# Patient Record
Sex: Male | Born: 1995 | Race: Black or African American | Hispanic: No | Marital: Single | Smoking: Current every day smoker
Health system: Southern US, Community
[De-identification: ages and names within clinical notes are randomized; demographics above are authoritative.]

---
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
## 2016-09-08 ENCOUNTER — Emergency Department (HOSPITAL_COMMUNITY)
Admission: EM | Admit: 2016-09-08 | Discharge: 2016-09-08 | Disposition: A | Discharge status: Home / Self Care | Payer: Medicaid Other | Attending: Physician Assistant | Admitting: Physician Assistant

## 2016-09-08 ENCOUNTER — Encounter (HOSPITAL_COMMUNITY): Payer: Self-pay

## 2016-09-08 ENCOUNTER — Emergency Department (HOSPITAL_COMMUNITY): Payer: Medicaid Other

## 2016-09-08 DIAGNOSIS — M25562 Pain in left knee: Secondary | ICD-10-CM | POA: Insufficient documentation

## 2016-09-08 DIAGNOSIS — X58XXXA Exposure to other specified factors, initial encounter: Secondary | ICD-10-CM | POA: Insufficient documentation

## 2016-09-08 DIAGNOSIS — Y929 Unspecified place or not applicable: Secondary | ICD-10-CM | POA: Insufficient documentation

## 2016-09-08 DIAGNOSIS — Y998 Other external cause status: Secondary | ICD-10-CM | POA: Insufficient documentation

## 2016-09-08 DIAGNOSIS — Y9361 Activity, american tackle football: Secondary | ICD-10-CM | POA: Insufficient documentation

## 2016-09-08 MED ORDER — ACETAMINOPHEN 325 MG PO TABS
650.0000 mg | ORAL_TABLET | Freq: Once | ORAL | Status: AC
  Administered 2016-09-08: 650 mg via ORAL
  Filled 2016-09-08: qty 2

## 2016-09-08 NOTE — Discharge Instructions (Signed)
Read the information below.  Your x-ray does not show any obvious fracture or dislocation. There is an effusion and soft tissue swelling. There may be damage to your ligaments. You are being placed in a knee immobilizer and provided crutches.  Please ice and elevate for 20 minute increments. You can take Tylenol or Motrin for pain relief.  Please call Orthopedics today to schedule a follow up appointment, I have provided the contact information above.  You may return to the Emergency Department at any time for worsening condition or any new symptoms that concern you. Return if you develop fever, warmth, redness, numbness, uncontrolled pain, or any other new/concerning symptoms.

## 2016-09-08 NOTE — ED Triage Notes (Signed)
Pt c/o L knee pain and swelling since last night.  Pain score 7/10.  Pt reports that he was playing football and "got tackled the wrong way."  Pt ambulated to room.
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
## 2018-04-02 IMAGING — CR DG KNEE COMPLETE 4+V*L*
5 series · 5 of 5 positions shown · non-contrast
Comparison: None.

CLINICAL DATA: 20-year-old male with generalized left knee pain and
soft tissue swelling since playing football yesterday. Initial
encounter.

EXAM:
LEFT KNEE - COMPLETE 4+ VIEW

[t knee ap left]
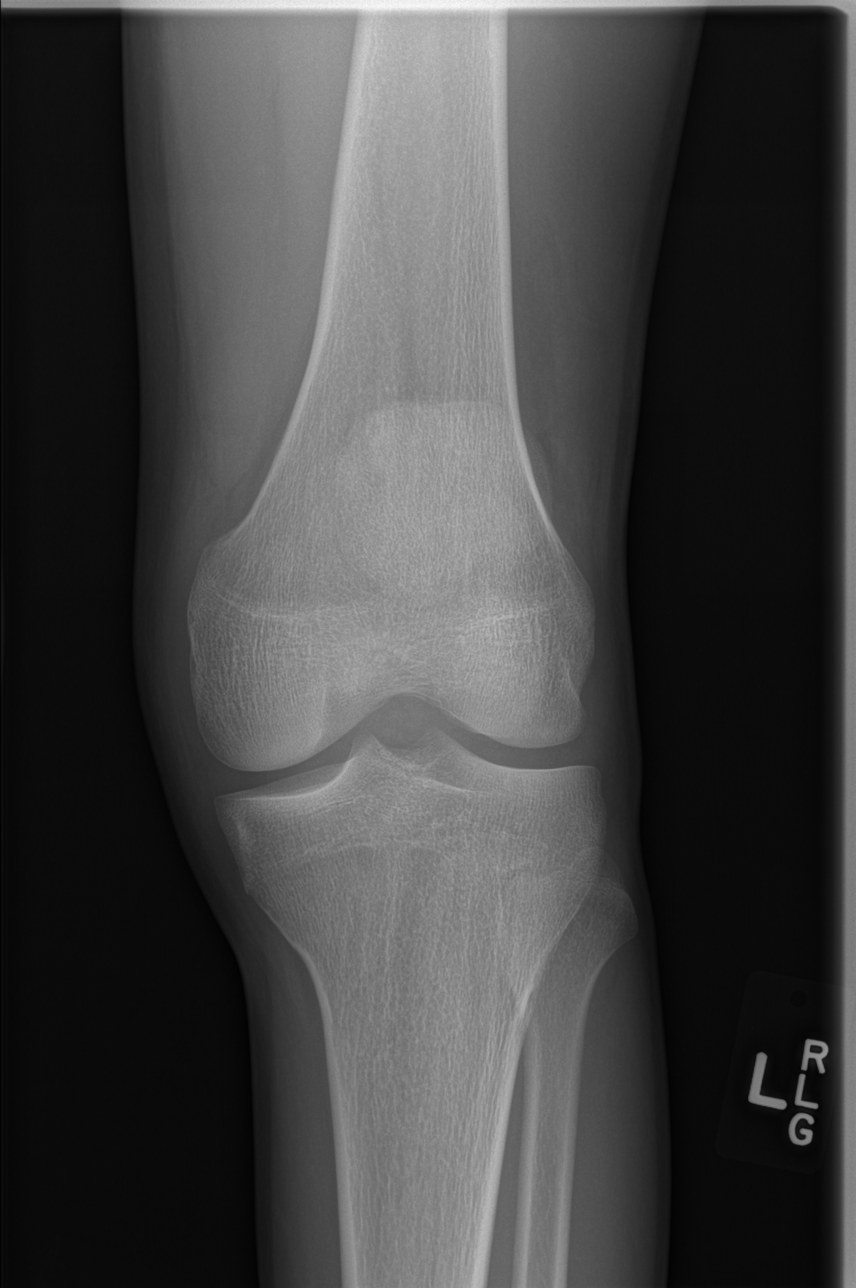

[t knee obl left (1 of 2)]
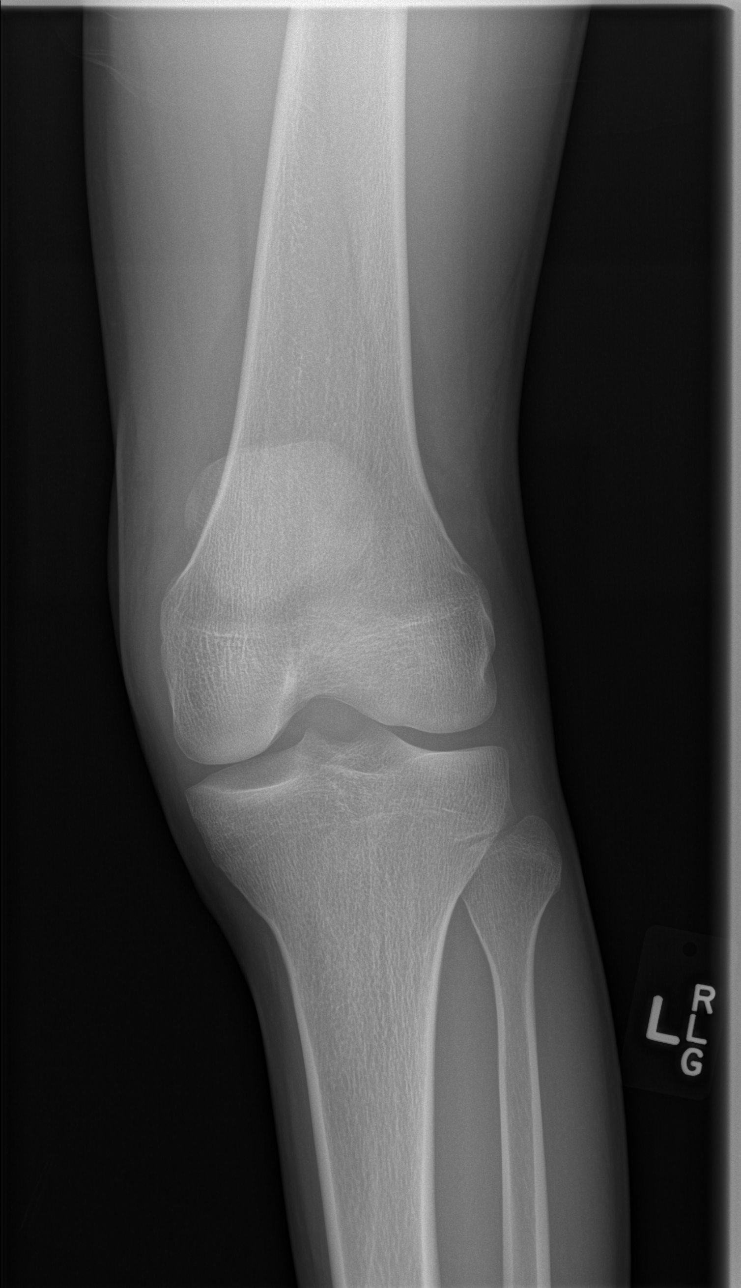

[t knee obl left (2 of 2)]
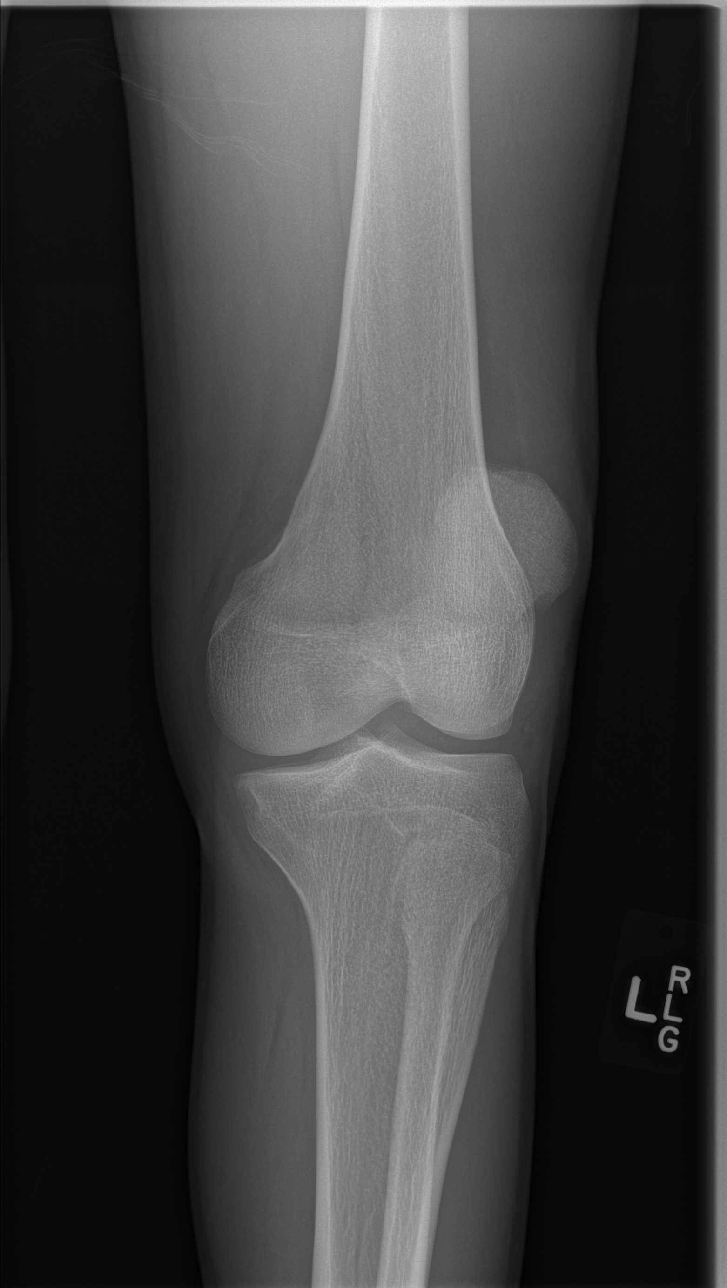

[t knee lat left (1 of 2)]
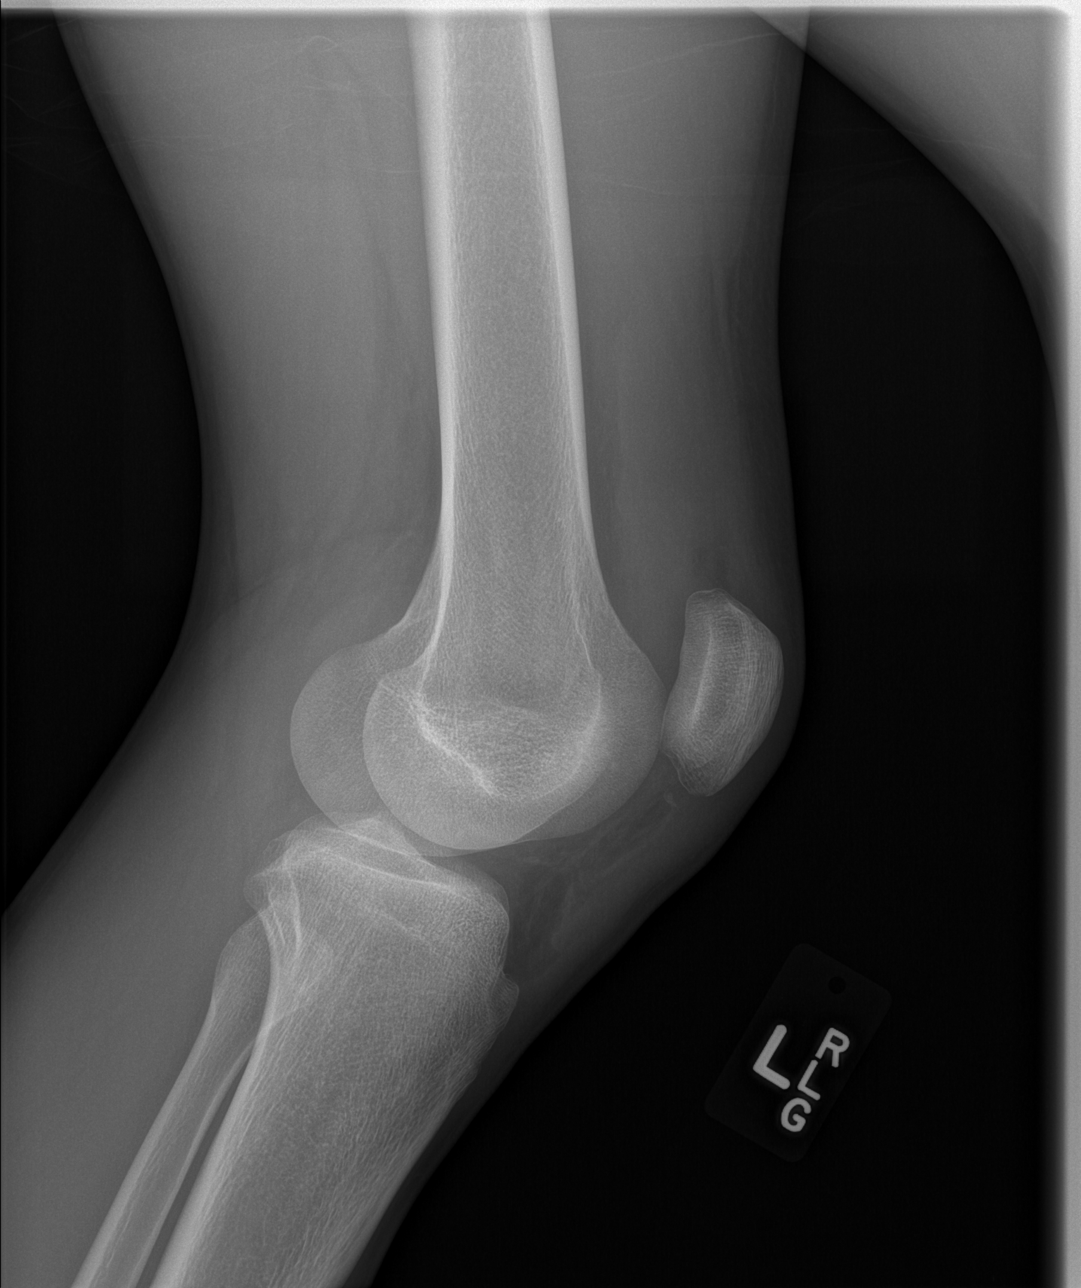

[t knee lat left (2 of 2)]
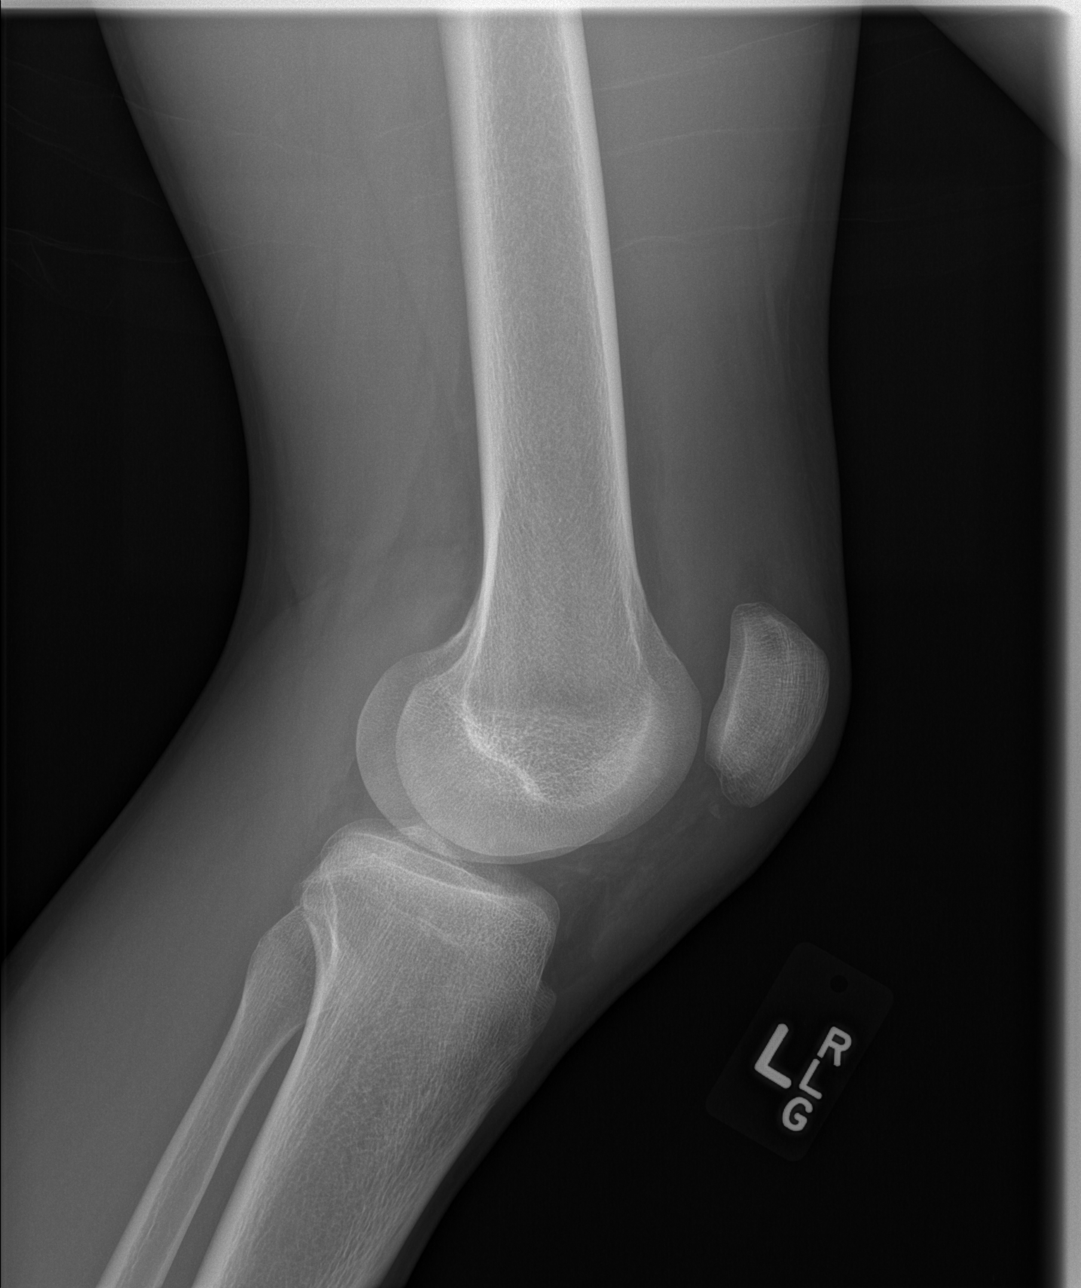

[5 of 5 positions shown; findings below may reference images not displayed]

FINDINGS: Moderate to large suprapatellar joint effusion. The patella appears
intact. There is fat stranding in Blain fat pad. There may be mild
superficial anterior soft tissue swelling. Joint spaces and
alignment are preserved. No acute fracture or dislocation.
IMPRESSION: Moderate to large joint effusion and soft tissue inflammation
raising the possibility of internal derangement. No acute fracture
or dislocation identified about the left knee.

## 2018-04-03 ENCOUNTER — Encounter (HOSPITAL_COMMUNITY): Payer: Self-pay | Admitting: *Deleted

## 2018-04-03 ENCOUNTER — Emergency Department (HOSPITAL_COMMUNITY)
Admission: EM | Admit: 2018-04-03 | Discharge: 2018-04-03 | Disposition: A | Discharge status: Home / Self Care | Payer: Self-pay | Attending: Emergency Medicine | Admitting: Emergency Medicine

## 2018-04-03 ENCOUNTER — Other Ambulatory Visit: Payer: Self-pay

## 2018-04-03 DIAGNOSIS — Z5321 Procedure and treatment not carried out due to patient leaving prior to being seen by health care provider: Secondary | ICD-10-CM | POA: Insufficient documentation

## 2018-04-03 DIAGNOSIS — M79642 Pain in left hand: Secondary | ICD-10-CM | POA: Insufficient documentation

## 2018-04-03 NOTE — ED Notes (Signed)
Pt called in lobby for registration and vitals. No response
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
## 2018-04-03 NOTE — ED Notes (Addendum)
Pt did not answer when called for rooming x 3 from [employer] RN.  Friend is also not able to be found.  Will d/c.

## 2018-04-05 NOTE — ED Notes (Signed)
Follow up call made  Unable to talk to pt who is at work  04/05/18  1003 s Alaney Witter rn

## 2018-05-28 ENCOUNTER — Other Ambulatory Visit: Payer: Self-pay

## 2018-05-28 ENCOUNTER — Emergency Department (HOSPITAL_COMMUNITY)
Admission: EM | Admit: 2018-05-28 | Discharge: 2018-05-28 | Disposition: A | Discharge status: Home / Self Care | Payer: Medicaid Other | Attending: Emergency Medicine | Admitting: Emergency Medicine

## 2018-05-28 ENCOUNTER — Encounter (HOSPITAL_COMMUNITY): Payer: Self-pay | Admitting: Emergency Medicine

## 2018-05-28 DIAGNOSIS — R42 Dizziness and giddiness: Secondary | ICD-10-CM

## 2018-05-28 DIAGNOSIS — Z202 Contact with and (suspected) exposure to infections with a predominantly sexual mode of transmission: Secondary | ICD-10-CM

## 2018-05-28 LAB — CBC WITH DIFFERENTIAL/PLATELET
ABS IMMATURE GRANULOCYTES: 0 10*3/uL (ref 0.0–0.1)
BASOS ABS: 0 10*3/uL (ref 0.0–0.1)
BASOS PCT: 1 %
EOS PCT: 6 %
Eosinophils Absolute: 0.3 10*3/uL (ref 0.0–0.7)
HCT: 43.4 % (ref 39.0–52.0)
Hemoglobin: 13.9 g/dL (ref 13.0–17.0)
Immature Granulocytes: 0 %
Lymphocytes Relative: 47 %
Lymphs Abs: 2.2 10*3/uL (ref 0.7–4.0)
MCH: 26.5 pg (ref 26.0–34.0)
MCHC: 32 g/dL (ref 30.0–36.0)
MCV: 82.8 fL (ref 78.0–100.0)
MONO ABS: 0.3 10*3/uL (ref 0.1–1.0)
Monocytes Relative: 6 %
NEUTROS ABS: 1.9 10*3/uL (ref 1.7–7.7)
Neutrophils Relative %: 40 %
Platelets: 171 10*3/uL (ref 150–400)
RBC: 5.24 MIL/uL (ref 4.22–5.81)
RDW: 13.8 % (ref 11.5–15.5)
WBC: 4.7 10*3/uL (ref 4.0–10.5)

## 2018-05-28 LAB — BASIC METABOLIC PANEL
Anion gap: 9 (ref 5–15)
BUN: 19 mg/dL (ref 6–20)
CALCIUM: 9.5 mg/dL (ref 8.9–10.3)
CO2: 25 mmol/L (ref 22–32)
Chloride: 105 mmol/L (ref 98–111)
Creatinine, Ser: 0.95 mg/dL (ref 0.61–1.24)
GFR calc Af Amer: >60 mL/min (ref >60)
GFR calc non Af Amer: >60 mL/min (ref >60)
GLUCOSE: 86 mg/dL (ref 70–99)
Potassium: 3.8 mmol/L (ref 3.5–5.1)
Sodium: 139 mmol/L (ref 135–145)

## 2018-05-28 MED ORDER — METRONIDAZOLE 500 MG PO TABS
2000.0000 mg | ORAL_TABLET | Freq: Once | ORAL | Status: AC
  Administered 2018-05-28: 2000 mg via ORAL
  Filled 2018-05-28: qty 4

## 2018-05-28 NOTE — ED Provider Notes (Signed)
Patient placed in Quick Look pathway, seen and evaluated   Chief Complaint: fatigue  HPI:   Philip Carter is a 22 y.o. male who presents to the ED with fatigue. Patient reports he started feeling tired today at work so he came to the ED. Patient denies any other symptoms.   ROS: General: fatigue  Physical Exam:  BP 117/78 (BP Location: Right Arm)   Pulse 70   Temp 99.3 F (37.4 C) (Oral)   Resp 16   Ht 5\' 7"  (1.702 m)   Wt 57.6 kg   SpO2 100%   BMI 19.89 kg/m    Gen: No distress  Neuro: Awake and Alert  Skin: Warm and ry   Initiation of care has begun. The patient has been counseled on the process, plan, and necessity for staying for the completion/evaluation, and the remainder of the medical screening examination    Janne Napoleon, NP 05/28/18 1624    Derwood Kaplan, MD 05/28/18 1644

## 2018-05-28 NOTE — Discharge Instructions (Signed)
Avoid intercourse for the next 5 days and do not have intercourse until you have no symptoms.  Free HIV and STD Testing These locations offer FREE confidential testing for HIV, Chlamydia, Gonorrhea, and Syphilis. Non-Traditional Testing Sites Address Telephone  [facility] [address], [place] [phone] Mondays 5pm - 7pm  [facility] [address], [address] [place] [phone] Wednesdays 2pm-8pm  [employer] [facility] [address], [place] [phone] Thursdays 9am-12noon 1pm-4pm  [facility] [address] [place] [phone] Tuesdays Thursdays 9am-12noon 1pm-4pm  [facility] [employer] offers free, confidential testing and treatment for HIV, Chlamydia, Gonorrhea, Syphilis, Herpes, Bacterial Vaginosis, Yeast, and Trichomoniasis. Traditional Testing   [facility] [address] [place] [phone]  Monday thru Friday  Call for an appointment  [facility] [address] [place] [phone] Monday thru Friday  Call for anappointment.  If you have any questions about this information please call [phone]. 07/10/2011

## 2018-05-28 NOTE — ED Triage Notes (Signed)
Pt reports drowsiness/fatigue that started today while he was at work. Denies any pain or any other sx.

## 2018-05-28 NOTE — ED Provider Notes (Signed)
[facility] EMERGENCY DEPARTMENT Provider Note   CSN: [id number] Arrival date & time: 05/28/18  1556     History   Chief Complaint Chief Complaint  Patient presents with  . Fatigue    HPI DONTEL HARSHBERGER is a 22 y.o. male emergency for with chief complaint of dizziness and need for treatment of trichomoniasis.  His girlfriend is at bedside and gives some of the history.  The patient's girlfriend is pregnant and states that she was tested for STDs at her OB/GYN today and was told that she was positive for trichomoniasis.  She has a prescription for Flagyl that she will take today.  Her boyfriend states that he needs treatment.  He denies any other symptoms.  Patient also states that this morning while he was at work he was feeling really lightheaded when he would go from sitting to standing.  He has drank a lot of water and feels much better and is not having those symptoms anymore.  He denies chest pain, melena or hematochezia.  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  HPI  History reviewed. No pertinent past medical history.  There are no active problems to display for this patient.   History reviewed. No pertinent surgical history.      Home Medications    Prior to Admission medications   Not on File    Family History No family history on file.  Social History Social History   Tobacco Use  . Smoking status: Never Smoker  . Smokeless tobacco: Never Used  Substance Use Topics  . Alcohol use: No  . Drug use: No     Allergies   Patient has no known allergies.   Review of Systems Review of Systems  Ten systems reviewed and are negative for acute change, except as noted in the HPI.   Physical Exam Updated Vital Signs BP 117/78 (BP Location: Right Arm)   Pulse 70   Temp 99.3 F (37.4 C) (Oral)   Resp 16   Ht 5\' 7"  (1.702 m)   Wt 57.6 kg   SpO2 100%   BMI 19.89 kg/m    Physical Exam Physical Exam  Nursing note and vitals reviewed. Constitutional: He appears well-developed and well-nourished. No distress.  HENT:  Head: Normocephalic and atraumatic.  Eyes: Conjunctivae normal are normal. No scleral icterus.  Neck: Normal range of motion. Neck supple.  Cardiovascular: Normal rate, regular rhythm and normal heart sounds.   Pulmonary/Chest: Effort normal and breath sounds normal. No respiratory distress.  Abdominal: Soft. There is no tenderness.  Musculoskeletal: He exhibits no edema.  Neurological: He is alert.  Skin: Skin is warm and dry. He is not diaphoretic.  Psychiatric: His behavior is normal.     ED Treatments / Results  Labs (all labs ordered are listed, but only abnormal results are displayed) Labs Reviewed  CBC WITH DIFFERENTIAL/PLATELET  BASIC METABOLIC PANEL    EKG None  Radiology No results found.  Procedures Procedures (including critical care time)  Medications Ordered in ED Medications - No data to display   Initial Impression / Assessment and Plan / ED Course  I have reviewed the triage vital signs and the nursing notes.  Pertinent labs & imaging results that were available during my care of the patient were reviewed by me and considered in my medical decision making (see chart for details).     Patient treated here  in the emergency department with 2 g Flagyl.  He and his partner are instructed to avoid intercourse for the next 5 days and to make sure they are asymptomatic.  He has no symptoms at this time.  Patient is otherwise hemodynamically stable and is not dizzy when he stands.  He appears appropriate for discharge at this time  Final Clinical Impressions(s) / ED Diagnoses   Final diagnoses:  Orthostatic lightheadedness  Exposure to STD    ED Discharge Orders    None       Arthor Captain, PA-C 05/28/18 1730    Benjiman Core, MD 05/29/18 0010

## 2020-03-21 ENCOUNTER — Other Ambulatory Visit: Payer: Self-pay

## 2020-03-21 ENCOUNTER — Ambulatory Visit (HOSPITAL_COMMUNITY)
Admission: EM | Admit: 2020-03-21 | Discharge: 2020-03-21 | Disposition: A | Discharge status: Home / Self Care | Payer: Medicaid Other | Attending: Family Medicine | Admitting: Family Medicine

## 2020-03-23 ENCOUNTER — Encounter (HOSPITAL_COMMUNITY): Payer: Self-pay | Admitting: Emergency Medicine

## 2020-03-23 ENCOUNTER — Other Ambulatory Visit: Payer: Self-pay

## 2020-03-23 ENCOUNTER — Emergency Department (HOSPITAL_COMMUNITY)
Admission: EM | Admit: 2020-03-23 | Discharge: 2020-03-24 | Disposition: A | Discharge status: Home / Self Care | Payer: Medicaid Other | Attending: Emergency Medicine | Admitting: Emergency Medicine

## 2020-03-23 DIAGNOSIS — Z5321 Procedure and treatment not carried out due to patient leaving prior to being seen by health care provider: Secondary | ICD-10-CM | POA: Insufficient documentation

## 2020-03-23 DIAGNOSIS — L02214 Cutaneous abscess of groin: Secondary | ICD-10-CM | POA: Insufficient documentation

## 2020-03-23 NOTE — ED Notes (Signed)
Pt called for VS, no response.  °

## 2020-03-23 NOTE — ED Notes (Signed)
No answer for VS 

## 2020-03-23 NOTE — ED Triage Notes (Signed)
Patient reports skin abscess at right inner groin with swelling onset this week , no fever /denies drainage .

## 2021-06-13 ENCOUNTER — Emergency Department (HOSPITAL_COMMUNITY)
Admission: EM | Admit: 2021-06-13 | Discharge: 2021-06-14 | Disposition: A | Discharge status: Home / Self Care | Payer: Medicaid Other | Attending: Emergency Medicine | Admitting: Emergency Medicine

## 2021-06-13 ENCOUNTER — Other Ambulatory Visit: Payer: Self-pay

## 2021-06-13 DIAGNOSIS — L729 Follicular cyst of the skin and subcutaneous tissue, unspecified: Secondary | ICD-10-CM | POA: Insufficient documentation

## 2021-06-13 DIAGNOSIS — Z5321 Procedure and treatment not carried out due to patient leaving prior to being seen by health care provider: Secondary | ICD-10-CM | POA: Insufficient documentation

## 2021-06-13 NOTE — ED Provider Notes (Signed)
Emergency Medicine Provider Triage Evaluation Note  Philip Carter , a 25 y.o. male  was evaluated in triage.  Pt complains of right posterior thigh cyst that has been there for 6 months.  He does report associated brown drainage.  He rates his cyst pain moderate in severity.  He denies any fevers or chills.  It is slightly improved after warm soaks.  Review of Systems  Positive:  Negative: See above  Physical Exam  BP 110/70 (BP Location: Left Arm)   Pulse 93   Temp 99.8 F (37.7 C) (Oral)   Resp 16   SpO2 97%  Gen:   Awake, no distress   Resp:  Normal effort  MSK:   Moves extremities without difficulty  Other:  10 cm area of swelling and induration.  There does appear to be a comedone over the swelling.  Not actively draining.  Medical Decision Making  Medically screening exam initiated at 6:03 PM.  Appropriate orders placed.  Joya Salm was informed that the remainder of the evaluation will be completed by another provider, this initial triage assessment does not replace that evaluation, and the importance of remaining in the ED until their evaluation is complete.     Teressa Lower, PA-C 06/13/21 1806    Benjiman Core, MD 06/13/21 2227

## 2021-06-13 NOTE — ED Triage Notes (Signed)
Pt with golf ball sized cyst on R posterior leg x 6 months. Drainage is brown.

## 2021-06-14 ENCOUNTER — Emergency Department (HOSPITAL_COMMUNITY)
Admission: EM | Admit: 2021-06-14 | Discharge: 2021-06-14 | Disposition: A | Discharge status: Home / Self Care | Payer: Medicaid Other | Attending: Emergency Medicine | Admitting: Emergency Medicine

## 2021-06-14 DIAGNOSIS — L02415 Cutaneous abscess of right lower limb: Secondary | ICD-10-CM | POA: Insufficient documentation

## 2021-06-14 DIAGNOSIS — L089 Local infection of the skin and subcutaneous tissue, unspecified: Secondary | ICD-10-CM

## 2021-06-14 DIAGNOSIS — L03115 Cellulitis of right lower limb: Secondary | ICD-10-CM | POA: Insufficient documentation

## 2021-06-14 MED ORDER — CEPHALEXIN 500 MG PO CAPS: 500.0000 mg | ORAL_CAPSULE | Freq: Two times a day (BID) | ORAL | 0 refills | Status: AC

## 2021-06-14 MED ORDER — IBUPROFEN 600 MG PO TABS: 600.0000 mg | ORAL_TABLET | Freq: Four times a day (QID) | ORAL | 0 refills | Status: AC | PRN

## 2021-06-14 MED ORDER — ACETAMINOPHEN 500 MG PO TABS
1000.0000 mg | ORAL_TABLET | Freq: Once | ORAL | Status: AC
Start: 2021-06-14 — End: 2021-06-14
  Administered 2021-06-14: 1000 mg via ORAL
  Filled 2021-06-14: qty 2

## 2021-06-14 MED ORDER — BUPIVACAINE HCL (PF) 0.5 % IJ SOLN
10.0000 mL | Freq: Once | INTRAMUSCULAR | Status: AC
  Administered 2021-06-14: 10 mL
  Filled 2021-06-14: qty 10

## 2021-06-14 NOTE — ED Provider Notes (Signed)
[facility] EMERGENCY DEPARTMENT Provider Note   CSN: [id number] Arrival date & time: 06/14/21  0841     History Chief Complaint  Patient presents with   Abscess    Philip Carter is a 25 y.o. male.  HPI Patient is healthy 25 year old male with no medical problems presenting to the ER today with complaints of 6 months of cyst on to his right posterior thigh. He states that it is uncomfortable and had some brown drainage from it.  He states that he generally does not have pain in his area just a large lump.  He states that over the past few days has felt more warm and painful to touch.  Denies any white drainage from the area.  Denies any fevers or chills cough congestion or any other significant symptoms.  Denies any cough congestion lightheadedness dizziness.  No other associate symptoms.  No history of IV drug use     No past medical history on file.  There are no problems to display for this patient.   No past surgical history on file.     No family history on file.  Social History   Tobacco Use   Smoking status: Never   Smokeless tobacco: Never  Vaping Use   Vaping Use: Never used  Substance Use Topics   Alcohol use: No   Drug use: No    Home Medications Prior to Admission medications   Medication Sig Start Date End Date Taking? Authorizing Provider  cephALEXin (KEFLEX) 500 MG capsule Take 1 capsule (500 mg total) by mouth 2 (two) times daily for 5 days. 06/14/21 06/19/21 Yes Roby Donaway S, PA  ibuprofen (ADVIL) 600 MG tablet Take 1 tablet (600 mg total) by mouth every 6 (six) hours as needed. 06/14/21  Yes Gailen Shelter, PA    Allergies    Patient has no known allergies.  Review of Systems   Review of Systems  Constitutional:  Negative for chills and fever.  HENT:  Negative for congestion.   Eyes:  Negative for pain.  Respiratory:  Negative for cough and shortness of breath.   Cardiovascular:  Negative for chest pain and  leg swelling.  Gastrointestinal:  Negative for abdominal pain and vomiting.  Genitourinary:  Negative for dysuria.  Musculoskeletal:  Negative for myalgias.  Skin:  Positive for wound. Negative for rash.  Neurological:  Negative for dizziness and headaches.   Physical Exam Updated Vital Signs BP 118/86 (BP Location: Left Arm)   Pulse 96   Temp 99.2 F (37.3 C) (Oral)   Resp 15   SpO2 99%   Physical Exam Vitals and nursing note reviewed.  Constitutional:      General: He is not in acute distress.    Appearance: Normal appearance. He is not ill-appearing.     Comments: Pleasant well-appearing 25 year old.  In no acute distress.  Sitting comfortably in bed.  Able answer questions appropriately follow commands. No increased work of breathing. Speaking in full sentences.   HENT:     Head: Normocephalic and atraumatic.     Mouth/Throat:     Mouth: Mucous membranes are moist.  Eyes:     General: No scleral icterus.       Right eye: No discharge.        Left eye: No discharge.     Conjunctiva/sclera: Conjunctivae normal.  Pulmonary:     Effort: Pulmonary effort is normal.     Breath sounds: No stridor.  Skin:  General: Skin is warm and dry.     Comments: 6 x 5 cm lump to the posterior aspect of the hamstring midline it is taut and not particularly fluctuant.  There is a small umbilication in the middle of this lesion.  There is some mild surrounding redness    Neurological:     Mental Status: He is alert and oriented to person, place, and time. Mental status is at baseline.    ED Results / Procedures / Treatments   Labs (all labs ordered are listed, but only abnormal results are displayed) Labs Reviewed - No data to display  EKG None  Radiology No results found.  Procedures .Marland KitchenIncision and Drainage  Date/Time: 06/14/2021 1:28 PM Performed by: Gailen Shelter, PA Authorized by: Gailen Shelter, PA   Consent:    Consent obtained:  Verbal   Consent given by:   Patient   Risks discussed:  Bleeding, incomplete drainage, pain and damage to other organs   Alternatives discussed:  No treatment Universal protocol:    Procedure explained and questions answered to patient or proxy's satisfaction: yes     Relevant documents present and verified: yes     Test results available : yes     Imaging studies available: yes     Required blood products, implants, devices, and special equipment available: yes     Site/side marked: yes     Immediately prior to procedure, a time out was called: yes     Patient identity confirmed:  Verbally with patient Location:    Type:  Abscess   Size:  5x6 cm   Location:  Lower extremity   Lower extremity location:  Leg   Leg location:  R upper leg (posterior) Pre-procedure details:    Skin preparation:  Chlorhexidine Anesthesia:    Anesthesia method:  Local infiltration   Local anesthetic:  Bupivacaine 0.5% w/o epi Procedure type:    Complexity:  Complex Procedure details:    Incision types:  Cruciate   Incision depth:  Subcutaneous   Wound management:  Probed and deloculated, irrigated with saline and extensive cleaning   Drainage amount:  Moderate   Packing material: Small amount of non - iodoform gauze. Post-procedure details:    Procedure completion:  Tolerated well, no immediate complications Comments:     Incision was made.  There is a sebaceous cyst underneath the skin.  This was bluntly dissected with curved Kellys. Cyst removed mostly intact.   Medications Ordered in ED Medications  bupivacaine (MARCAINE) 0.5 % injection 10 mL (10 mLs Infiltration Given by Other 06/14/21 1234)  acetaminophen (TYLENOL) tablet 1,000 mg (1,000 mg Oral Given 06/14/21 1234)    ED Course  I have reviewed the triage vital signs and the nursing notes.  Pertinent labs & imaging results that were available during my care of the patient were reviewed by me and considered in my medical decision making (see chart for details).     MDM Rules/Calculators/A&P                          Pt here w cyst. Seems to have some mild redness around it. No purulence on I&D  Does have some surrounding redness we will provide patient with antibiotics for superficial cellulitis.   Will follow up in 48 hours for wound re-check.   Keflex as prophylaxis / tx for mild cellulitis. Suspect will improve now that incision is completed. Please use Tylenol or ibuprofen for  pain.  You may use 600 mg ibuprofen every 6 hours or 1000 mg of Tylenol every 6 hours.  You may choose to alternate between the 2.  This would be most effective.  Not to exceed 4 g of Tylenol within 24 hours.  Not to exceed 3200 mg ibuprofen 24 hours.   Return precautions given.   Final Clinical Impression(s) / ED Diagnoses Final diagnoses:  Infected cyst of skin    Rx / DC Orders ED Discharge Orders          Ordered    cephALEXin (KEFLEX) 500 MG capsule  2 times daily        06/14/21 1321    ibuprofen (ADVIL) 600 MG tablet  Every 6 hours PRN        06/14/21 1321             Solon Augusta Perrinton, [place] 06/14/21 1333    Linwood Dibbles, MD 06/15/21 1502

## 2021-06-14 NOTE — Discharge Instructions (Signed)
Please keep the area clean and dry for the next 24 hours.  Please have your wound rechecked in 48 hours  I have placed a small amount of packing material into the wound.  It will either fall out on its own or be removed when you have your wound checked in 48 hours.  Please take the antibiotics as prescribed.  Please use Tylenol or ibuprofen for pain.  You may use 600 mg ibuprofen every 6 hours or 1000 mg of Tylenol every 6 hours.  You may choose to alternate between the 2.  This would be most effective.  Not to exceed 4 g of Tylenol within 24 hours.  Not to exceed 3200 mg ibuprofen 24 hours.

## 2021-06-14 NOTE — ED Triage Notes (Signed)
Pt. Stated, I have a cyst on my rt. Thigh for a month.  The size of 50 cent piece.

## 2023-11-09 ENCOUNTER — Ambulatory Visit
Admission: EM | Admit: 2023-11-09 | Discharge: 2023-11-09 | Disposition: A | Discharge status: Home / Self Care | Payer: Self-pay | Attending: Physician Assistant | Admitting: Physician Assistant

## 2023-11-09 ENCOUNTER — Ambulatory Visit (HOSPITAL_COMMUNITY): Payer: Self-pay

## 2023-11-09 DIAGNOSIS — Z113 Encounter for screening for infections with a predominantly sexual mode of transmission: Secondary | ICD-10-CM | POA: Insufficient documentation

## 2023-11-09 NOTE — ED Triage Notes (Signed)
"  I just want to get a check up for STI, no symptoms". I do not have a PCP. Discussed in length.

## 2023-11-09 NOTE — ED Provider Notes (Signed)
 [facility]    CSN: [id number] Arrival date & time: 11/09/23  1513      History   Chief Complaint Chief Complaint  Patient presents with   SEXUALLY TRANSMITTED DISEASE    Testing    HPI Philip Carter is a 28 y.o. male.   Patient here today for STD screening.  He denies any current symptoms.  He has not had any known exposures.  The history is provided by the patient.    History reviewed. No pertinent past medical history.  There are no active problems to display for this patient.   History reviewed. No pertinent surgical history.     Home Medications    Prior to Admission medications   Medication Sig Start Date End Date Taking? Authorizing Provider  ibuprofen (ADVIL) 600 MG tablet Take 1 tablet (600 mg total) by mouth every 6 (six) hours as needed. 06/14/21   Gailen Shelter, PA    Family History History reviewed. No pertinent family history.  Social History Social History   Tobacco Use   Smoking status: Every Day    Types: Cigarettes   Smokeless tobacco: Never  Vaping Use   Vaping status: Never Used  Substance Use Topics   Alcohol use: No   Drug use: No     Allergies   Patient has no known allergies.   Review of Systems Review of Systems  Constitutional:  Negative for chills and fever.  Eyes:  Negative for discharge and redness.  Genitourinary:  Negative for dysuria, genital sores and penile discharge.  Neurological:  Negative for numbness.     Physical Exam Triage Vital Signs ED Triage Vitals  Encounter Vitals Group     BP      Systolic BP Percentile      Diastolic BP Percentile      Pulse      Resp      Temp      Temp src      SpO2      Weight      Height      Head Circumference      Peak Flow      Pain Score      Pain Loc      Pain Education      Exclude from Growth Chart    No data found.  Updated Vital Signs BP 122/76 (BP Location: Right Arm)   Pulse 88   Temp 98.2 F (36.8 C) (Oral)   Resp 18    Ht 5\' 6"  (1.676 m)   Wt 130 lb (59 kg)   SpO2 97%   BMI 20.98 kg/m   Visual Acuity Right Eye Distance:   Left Eye Distance:   Bilateral Distance:    Right Eye Near:   Left Eye Near:    Bilateral Near:     Physical Exam Vitals and nursing note reviewed.  Constitutional:      General: He is not in acute distress.    Appearance: Normal appearance. He is not ill-appearing.  HENT:     Head: Normocephalic and atraumatic.  Eyes:     Conjunctiva/sclera: Conjunctivae normal.  Cardiovascular:     Rate and Rhythm: Normal rate.  Pulmonary:     Effort: Pulmonary effort is normal. No respiratory distress.  Neurological:     Mental Status: He is alert.  Psychiatric:        Mood and Affect: Mood normal.        Behavior: Behavior normal.  Thought Content: Thought content normal.      UC Treatments / Results  Labs (all labs ordered are listed, but only abnormal results are displayed) Labs Reviewed  RPR  HIV ANTIBODY (ROUTINE TESTING W REFLEX)  CYTOLOGY, (ORAL, ANAL, URETHRAL) ANCILLARY ONLY    EKG   Radiology No results found.  Procedures Procedures (including critical care time)  Medications Ordered in UC Medications - No data to display  Initial Impression / Assessment and Plan / UC Course  I have reviewed the triage vital signs and the nursing notes.  Pertinent labs & imaging results that were available during my care of the patient were reviewed by me and considered in my medical decision making (see chart for details).     STD screening ordered as requested.  Will await results for further recommendation.   Final Clinical Impressions(s) / UC Diagnoses   Final diagnoses:  Screening for STD (sexually transmitted disease)   Discharge Instructions   None    ED Prescriptions   None    PDMP not reviewed this encounter.   Tomi Bamberger, PA-C 11/09/23 [phone]

## 2023-11-10 LAB — HIV ANTIBODY (ROUTINE TESTING W REFLEX): HIV Screen 4th Generation wRfx: NONREACTIVE

## 2023-11-10 LAB — RPR: RPR Ser Ql: NONREACTIVE

## 2023-11-11 LAB — CYTOLOGY, (ORAL, ANAL, URETHRAL) ANCILLARY ONLY
Chlamydia: NEGATIVE
Comment: NEGATIVE
Comment: NEGATIVE
Comment: NORMAL
Neisseria Gonorrhea: NEGATIVE
Trichomonas: NEGATIVE
